# Patient Record
Sex: Female | Born: 2000 | Race: White | Hispanic: No | Marital: Single | Smoking: Never smoker
Health system: Southern US, Community
[De-identification: ages and names within clinical notes are randomized; demographics above are authoritative.]

## PROBLEM LIST (undated history)

## (undated) DIAGNOSIS — N390 Urinary tract infection, site not specified: Secondary | ICD-10-CM

## (undated) HISTORY — PX: URETER SURGERY: SHX823

---
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
## 2021-03-22 DIAGNOSIS — M545 Low back pain, unspecified: Secondary | ICD-10-CM | POA: Diagnosis not present

## 2021-03-22 DIAGNOSIS — Y93F2 Activity, caregiving, lifting: Secondary | ICD-10-CM | POA: Diagnosis not present

## 2021-04-13 DIAGNOSIS — Z130 Encounter for screening for diseases of the blood and blood-forming organs and certain disorders involving the immune mechanism: Secondary | ICD-10-CM | POA: Diagnosis not present

## 2021-04-13 DIAGNOSIS — N390 Urinary tract infection, site not specified: Secondary | ICD-10-CM | POA: Diagnosis not present

## 2021-04-13 DIAGNOSIS — Z3040 Encounter for surveillance of contraceptives, unspecified: Secondary | ICD-10-CM | POA: Diagnosis not present

## 2021-04-13 DIAGNOSIS — Z13228 Encounter for screening for other metabolic disorders: Secondary | ICD-10-CM | POA: Diagnosis not present

## 2021-04-13 DIAGNOSIS — F419 Anxiety disorder, unspecified: Secondary | ICD-10-CM | POA: Diagnosis not present

## 2021-04-21 IMAGING — US US PELVIS COMPLETE WITH TRANSVAGINAL
1 series · 15 of 25 positions shown · non-contrast
Comparison: None

CLINICAL DATA: Dyspareunia



[Series 1: us pelvis complete with transvaginal · 15 of 94 slices shown]
[im 1/94]
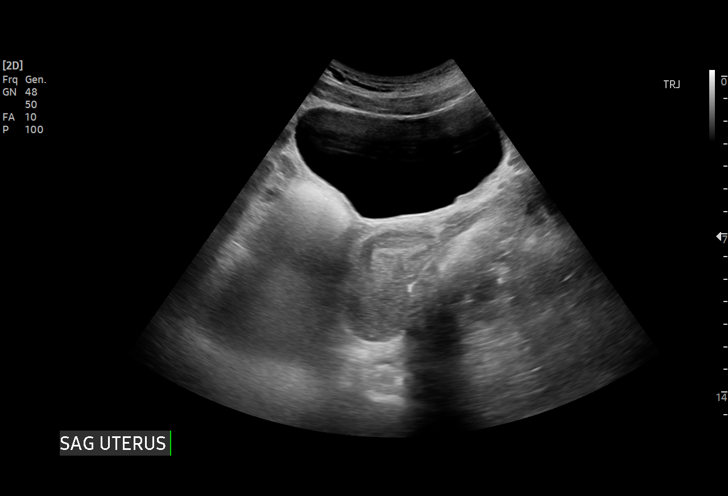
[im 8/94]
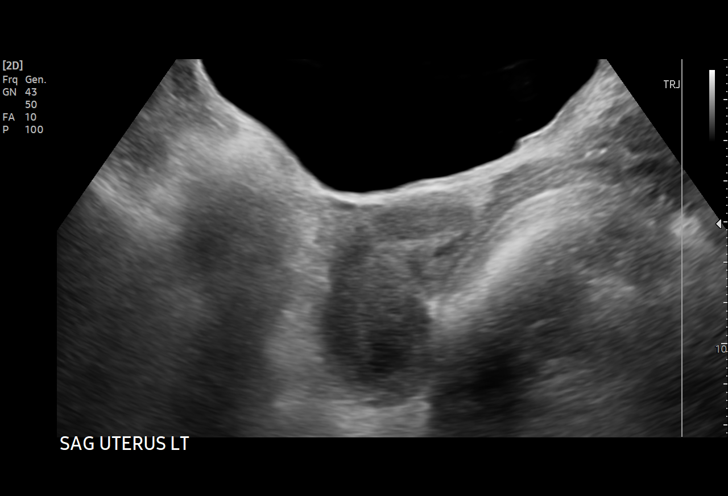
[im 16/94]
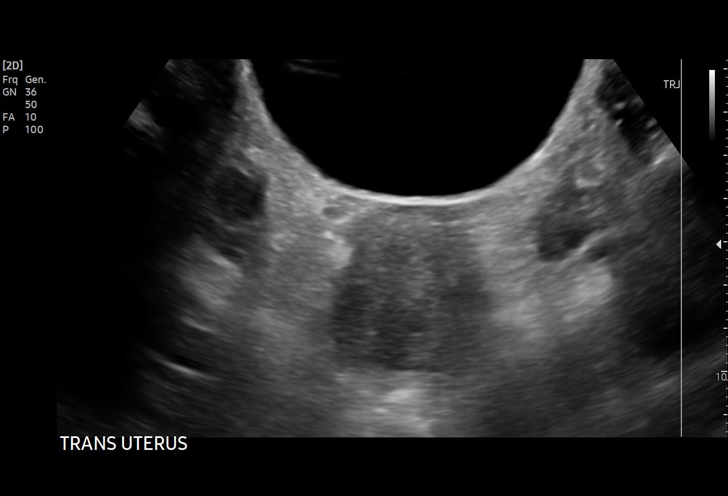
[im 20/94]
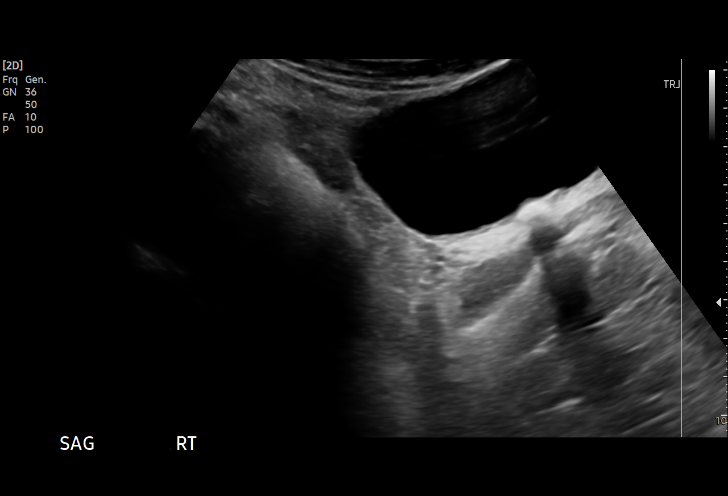
[im 28/94]
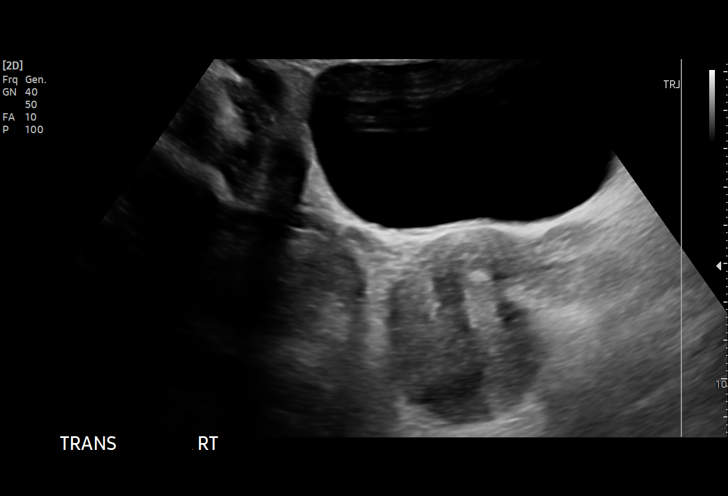
[im 35/94]
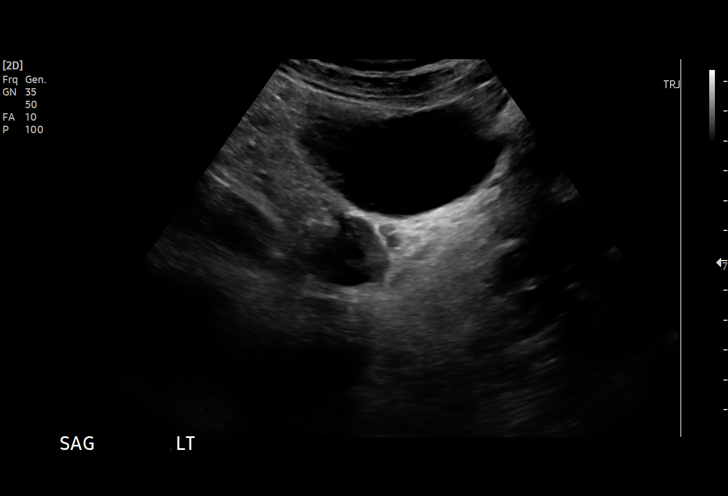
[im 39/94]
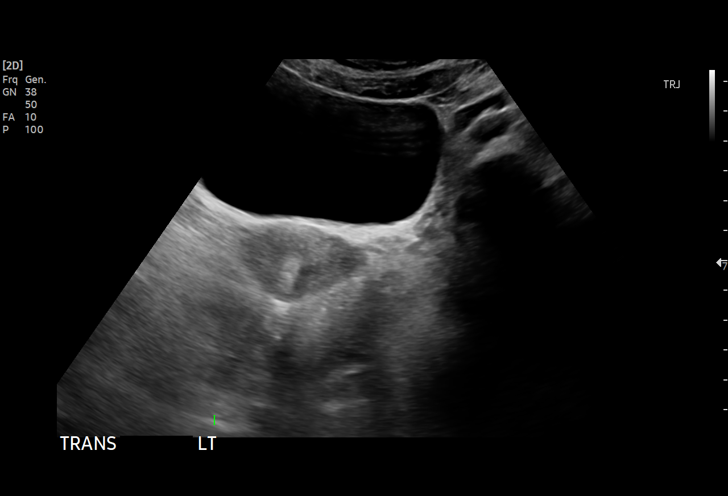
[im 47/94]
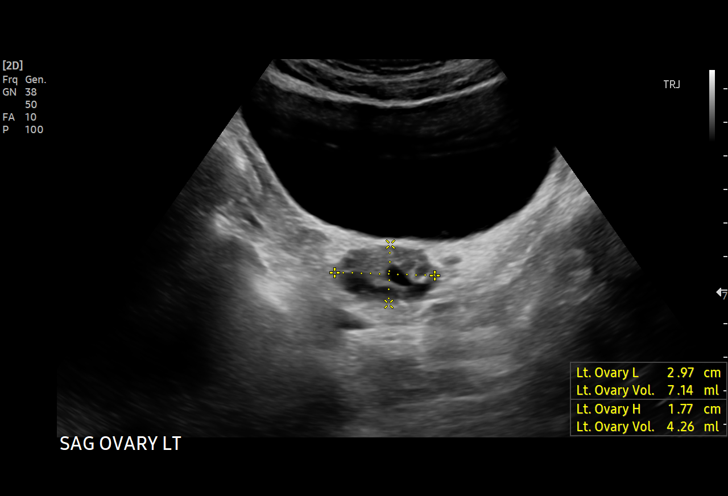
[im 55/94]
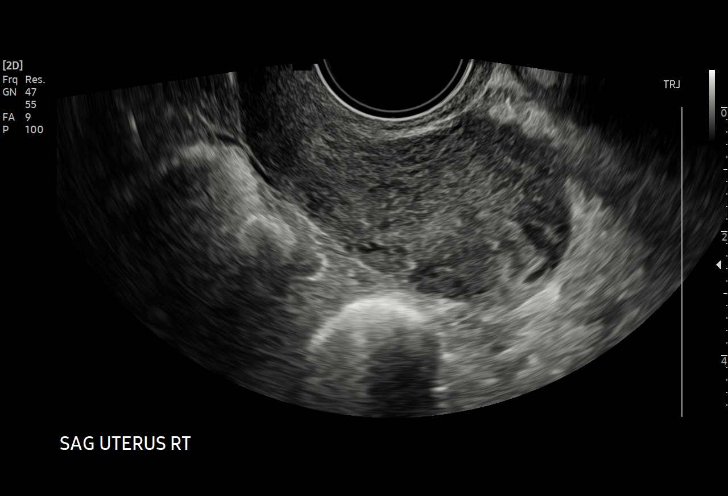
[im 59/94]
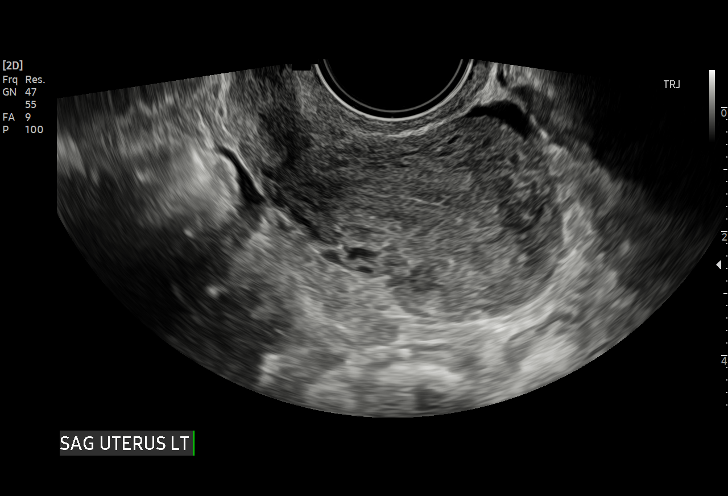
[im 66/94]
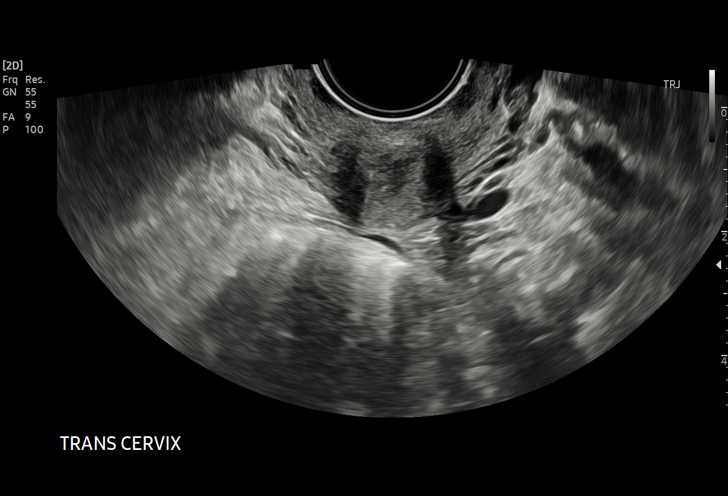
[im 74/94]
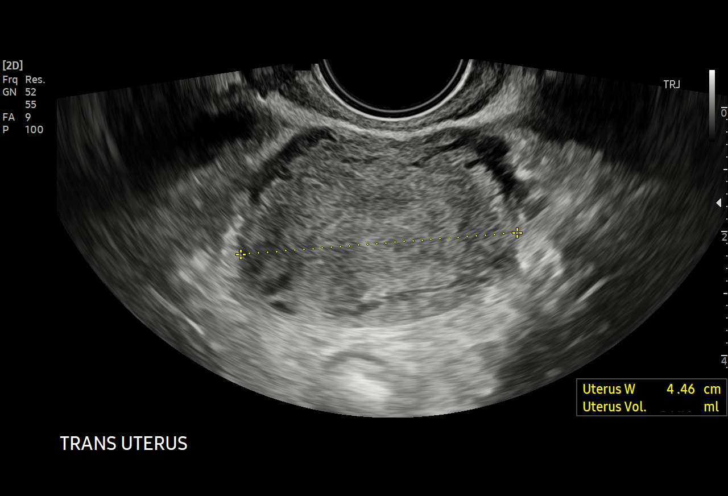
[im 78/94]
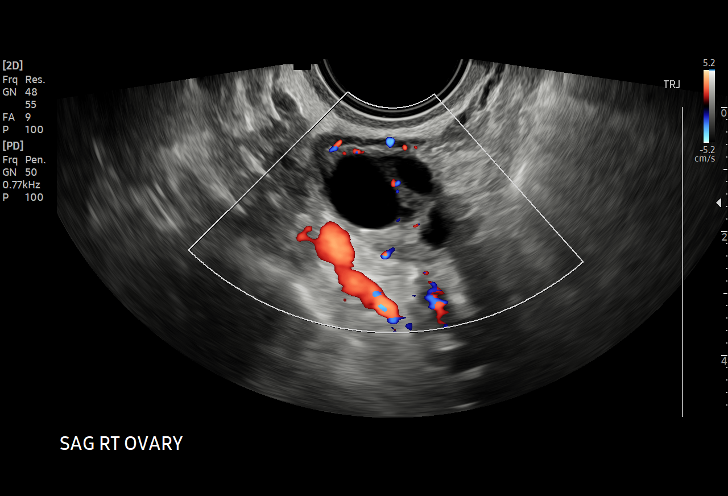
[im 86/94]
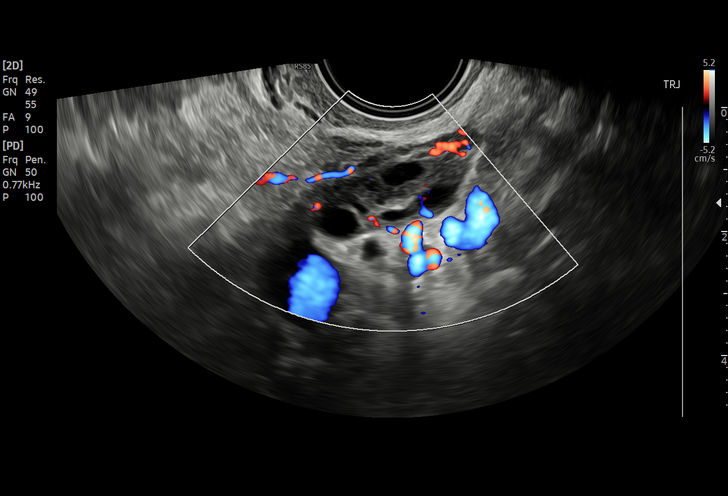
[im 94/94]
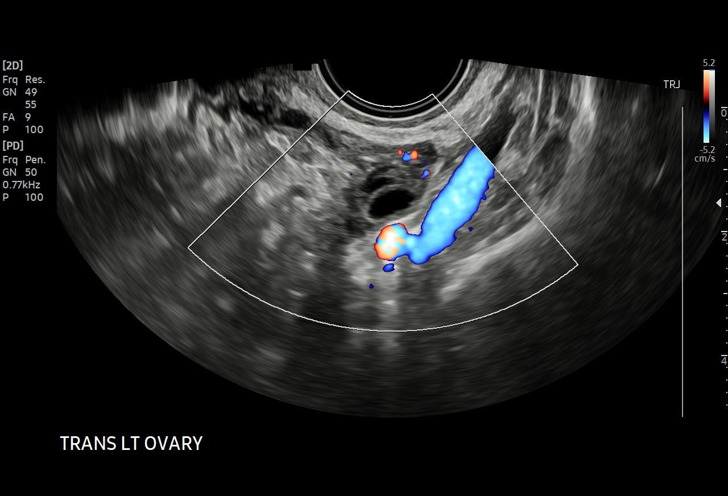

[15 of 25 positions shown; findings below may reference images not displayed]

FINDINGS: Uterus

Measurements: 6.4 x 2.8 x 4.6 cm = volume: 44 mL. No fibroids or
other mass visualized. Retroflexed.

Endometrium

Thickness: 5 mm in thickness.  No focal abnormality visualized.

Right ovary

Measurements: 2.8 x 1.8 x 2.0 cm = volume: 5.1 mL. Normal
appearance/no adnexal mass. Multiple small follicles.

Left ovary

Measurements: 2.8 x 1.3 x 1.4 cm = volume: 2.7 mL. Normal
appearance/no adnexal mass. Multiple small follicles.

Other findings

No abnormal free fluid.
IMPRESSION: Normal study.

## 2021-05-03 ENCOUNTER — Ambulatory Visit: Payer: Medicaid Other | Admitting: Obstetrics and Gynecology

## 2021-05-18 DIAGNOSIS — L089 Local infection of the skin and subcutaneous tissue, unspecified: Secondary | ICD-10-CM | POA: Diagnosis not present

## 2021-05-18 DIAGNOSIS — T7840XA Allergy, unspecified, initial encounter: Secondary | ICD-10-CM | POA: Diagnosis not present

## 2021-06-19 DIAGNOSIS — F329 Major depressive disorder, single episode, unspecified: Secondary | ICD-10-CM | POA: Diagnosis not present

## 2021-06-19 DIAGNOSIS — R300 Dysuria: Secondary | ICD-10-CM | POA: Diagnosis not present

## 2021-06-19 DIAGNOSIS — N390 Urinary tract infection, site not specified: Secondary | ICD-10-CM | POA: Diagnosis not present

## 2021-07-03 ENCOUNTER — Ambulatory Visit (INDEPENDENT_AMBULATORY_CARE_PROVIDER_SITE_OTHER): Payer: Medicaid Other | Admitting: Obstetrics and Gynecology

## 2021-07-03 ENCOUNTER — Encounter: Payer: Self-pay | Admitting: Obstetrics and Gynecology

## 2021-07-03 DIAGNOSIS — R87612 Low grade squamous intraepithelial lesion on cytologic smear of cervix (LGSIL): Secondary | ICD-10-CM | POA: Diagnosis not present

## 2021-07-03 DIAGNOSIS — F320 Major depressive disorder, single episode, mild: Secondary | ICD-10-CM | POA: Diagnosis not present

## 2021-07-03 DIAGNOSIS — B029 Zoster without complications: Secondary | ICD-10-CM | POA: Diagnosis not present

## 2021-07-03 NOTE — Progress Notes (Signed)
Patient is here to follow up on abnormal pap smear that she had done around November of last year at [facility]. She informed e of her results which were "HPV with LSIL".  ?

## 2021-07-03 NOTE — Progress Notes (Signed)
Pt reports to discuss abnormal pap smear results from 11/22. ? ?Pt had pap smear with HPV testing due to FH of cervical cancer and HPV. ?Pt reports she has completed the HPV vaccine ?Sexual active.  ? ?Pap LGSIL, HPV + 11/22 ? ?PE AF VSS ?Lungs clear Heart RRR ?Abd soft + BS ? ? ?A/P LGSIL HPV +  ? ?Information on HPV and LGSIL provided to pt. Recommend repeat pap smear with HPV co testing 11/23. ? ?

## 2021-07-03 NOTE — Patient Instructions (Signed)
Cervical Dysplasia ? ?Cervical dysplasia is a condition in which the cells in a woman's cervix have abnormal changes. The cervix is the opening of the uterus. It is located between the vagina and the uterus. Cervical dysplasia may be an early sign of cervical cancer. ?If left untreated, this condition may become more severe and may progress to cervical cancer. Early detection, treatment, and follow-up care are very important. ?What are the causes? ?Cervical dysplasia is usually caused by a human papillomavirus (HPV) infection. HPV is spread from person to person through sexual contact. This includes oral, vaginal, or anal sex. HPV is the most common sexually transmitted infection (STI). ?You are more likely to be exposed to HPV through sexual contact if: ?You have had more than one sexual partner or you have a sexual partner who has multiple sexual partners. ?You do not use a condom during sex, especially with new sexual partners. ?What increases the risk? ?The following factors may make you more likely to develop this condition: ?Having a family history of cervical cancer or a personal history of cancer of the vagina or vulva. ?Having had an STI, such as herpes, chlamydia, or gonorrhea. ?Becoming sexually active before age 17. ?Having a weakened disease-fighting system (immunesystem). ?Smoking. ?Being the daughter of a woman who took diethylstilbestrol (DES), a synthetic estrogen, during pregnancy. ?What are the signs or symptoms? ?There are usually no symptoms of this condition. If you do have symptoms, they may include: ?Abnormal vaginal discharge. ?Bleeding between periods or after sex. ?Bleeding during menopause. ?Pain during sex. ?How is this diagnosed? ?This condition may be diagnosed with a Pap test. During this test, cells are swabbed from the cervix and checked under a microscope. ?If the Pap test is abnormal or if the cervix looks abnormal, you may also have a test in which a tissue sample is removed from  the cervix and looked at under a microscope(biopsy). ?How is this treated? ?Treatment varies based on the severity of the condition. Treatment may include: ?Cryotherapy. During this therapy, the abnormal cells are frozen with a steel-tipped instrument. ?Loop electrosurgical excision procedure (LEEP). LEEP removes abnormal tissue from the cervix. ?Surgery to remove abnormal tissue. This is usually done in more severe cases. Options include: ?A cone biopsy. This treatment removes the cervical canal and part of the center of the cervix. ?Hysterectomy. This is a surgery in which the uterus and cervix are removed. ?Follow these instructions at home: ?Take over-the-counter and prescription medicines only as told by your health care provider. ?Do not use tampons, have sex, or douche until your health care provider says it is safe. ?Keep all follow-up visits. This is important. Women who have been treated for cervical dysplasia should have regular pelvic exams and Pap tests. ?How is this prevented? ?Practice safe sex to help prevent STIs. ?Have regular Pap tests. Talk with your health care provider about how often you need these tests. Pap tests will help identify cell changes that can lead to cancer. ?Ask your health care provider about possible vaccines to protect yourself against HPV. ?Contact a health care provider if: ?You develop genital warts. The risk of cervical cancer is higher with certain types of HPV. ?Your menstrual period is heavier than normal or you develop bright red bleeding, which may include blood clots. ?You have abnormal vaginal discharge. ?You have a fever. ?Get help right away if: ?You have pain or cramps in the abdomen that get worse, and medicine does not help to relieve your pain. ?You feel  light-headed and are unusually weak, or you faint. ?Summary ?Cervical dysplasia is a condition in which a woman's cervix cells have abnormal changes. ?If left untreated, this condition may become more severe  and may progress to cervical cancer. ?Early detection, treatment, and follow-up care are very important in managing this condition. ?Have regular pelvic exams and Pap tests. Talk with your health care provider about how often you need these tests. Pap tests will help identify cell changes that can lead to cancer. ?This information is not intended to replace advice given to you by your health care provider. Make sure you discuss any questions you have with your health care provider. ?Document Revised: 08/27/2019 Document Reviewed: 08/27/2019 ?Elsevier Patient Education ? [place]. ?HPV and Cancer Information ?HPV (human papillomavirus)is a very common virus that spreads easily from person to person through skin-to-skin or sexual contact. There are many types of HPV. It often does not cause symptoms. However, depending upon the type, it may sometimes cause warts in the genitals (genital or mucosal HPV), or on the hands or feet (cutaneous or nonmucosal HPV). It is possible to be infected for a long time and pass HPV to others without knowing it. ?Some HPV infections go away on their own within 2 years, but other HPV infections are considered high-risk and may cause changes in cells that could lead to cancer. You can take steps to avoid HPV infection and to lower your risk of getting cancer. ?How can HPV affect me? ?HPV can cause warts in the genitals or on the hands or feet. It can also cause wart-like lesions in the throat.  ?Certain types of genital HPV can also cause cancer, which may include: ?Cervical cancer. ?Vaginal cancer. ?Vulvar cancer. ?Anal cancer. ?Throat cancer. ?Tongue or mouth cancer. ?Penile cancer. ?How does HPV spread? ?HPV spreads easily through direct person to person contact. Genital HPV spreads through sexual contact. You can get HPV from vaginal sex, oral sex, anal sex, or just by touching someone's genitals. Even people who have only one sexual partner may have HPV because that partner  may have it. HPV often does not cause symptoms, so most infected people do not know that they have it. ?What actions can I take to prevent HPV? ?Take the following steps to help prevent HPV infection: ?Talk with your health care provider about getting the HPV vaccine. This vaccine protects against the types of HPV that could cause cancer. ?Limit the number of people you have sex with. Also, avoid having sex with people who have had many sexual partners. ?Use a condom during sex. ?Talk with your sexual partners about their health. ?What actions can I take to lower my risk for cancer? ?Having a healthy lifestyle and taking some preventive steps can help lower your cancer risk, whether or not you have genital HPV. Some steps you can take include: ?Lifestyle ?Practice safe sex to help prevent HPV infection. ?Do not use any products that contain nicotine or tobacco, such as cigarettes, e-cigarettes, and chewing tobacco. If you need help quitting, ask your health care provider. ?Eat foods that have antioxidants, such as fruits, vegetables, and grains. Try to eat at least 5 servings of fruits and vegetables every day. ?Get regular exercise. ?Lose weight if you are overweight. ?Practice good oral hygiene. This includes flossing and brushing your teeth every day. ?Other preventive steps ?Get the HPV vaccine as told by your health care provider. ?Get tested for STIs even if you do not have symptoms of HPV.  You may have HPV and not know it. ?If you are a woman, get regular Pap and HPV tests. Talk with your health care provider about how often you need these tests. Pap tests will help identify changes in cells that can lead to cancer. HPV tests will help identify the presence of HPV in cells in the cervix. ?Where to find more information ?Learn more about HPV and cancer from: ?Centers for Disease Control and Prevention: http://sweeney-todd.com/ ?[place]: www.cancer.gov ?American Cancer Society: www.cancer.org ?Contact  a health care provider if: ?You have genital warts. ?You are sexually active and think you may have HPV. ?You did not protect yourself during sex and would like to be tested for STIs. ?Summary ?Human

## 2022-04-22 ENCOUNTER — Ambulatory Visit (HOSPITAL_COMMUNITY)
Admission: EM | Admit: 2022-04-22 | Discharge: 2022-04-22 | Disposition: A | Discharge status: Home / Self Care | Payer: Medicaid Other | Attending: Emergency Medicine | Admitting: Emergency Medicine

## 2022-04-22 ENCOUNTER — Encounter (HOSPITAL_COMMUNITY): Payer: Self-pay

## 2022-04-22 DIAGNOSIS — Z87440 Personal history of urinary (tract) infections: Secondary | ICD-10-CM | POA: Insufficient documentation

## 2022-04-22 DIAGNOSIS — F17290 Nicotine dependence, other tobacco product, uncomplicated: Secondary | ICD-10-CM | POA: Insufficient documentation

## 2022-04-22 DIAGNOSIS — Z1152 Encounter for screening for COVID-19: Secondary | ICD-10-CM | POA: Insufficient documentation

## 2022-04-22 DIAGNOSIS — R109 Unspecified abdominal pain: Secondary | ICD-10-CM

## 2022-04-22 DIAGNOSIS — B349 Viral infection, unspecified: Secondary | ICD-10-CM | POA: Insufficient documentation

## 2022-04-22 LAB — POC INFLUENZA A AND B ANTIGEN (URGENT CARE ONLY)
INFLUENZA A ANTIGEN, POC: NEGATIVE
INFLUENZA B ANTIGEN, POC: NEGATIVE

## 2022-04-22 LAB — POCT URINALYSIS DIPSTICK, ED / UC
Bilirubin Urine: NEGATIVE
Glucose, UA: NEGATIVE mg/dL
Hgb urine dipstick: NEGATIVE
Ketones, ur: NEGATIVE mg/dL
Leukocytes,Ua: NEGATIVE
Nitrite: NEGATIVE
Protein, ur: NEGATIVE mg/dL
Specific Gravity, Urine: 1.025 (ref 1.005–1.030)
Urobilinogen, UA: 1 mg/dL (ref 0.0–1.0)
pH: 6.5 (ref 5.0–8.0)

## 2022-04-22 LAB — POC URINE PREG, ED: Preg Test, Ur: NEGATIVE

## 2022-04-22 NOTE — ED Provider Notes (Signed)
[facility]    CSN: [id number] Arrival date & time: 04/22/22  1724      History   Chief Complaint Chief Complaint  Patient presents with   Back Pain    HPI Jaime Osborne is a 22 y.o. female.   Patient presents with concerns of possible UTI or kidney infection. She states yesterday she developed low back pain, worse on the right. She felt feverish last night with a 99.2F temp and chills. She has had some intermittent nausea but no vomiting. She reports some general malaise and mild headache and lower abdominal discomfort. She denies any urinary frequency or urgency, dysuria, or discharge. She does state she is urinating less often than usual. She took some NyQuil last night. The patient does report history of frequent UTIs and has had a kidney infection in the past as well. She states she took a home UTI test that was positive for leuks but negative for nitrites. She reports some mild throat irritation and congestion as well but denies cough.   The history is provided by the patient.  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 Authorizing Provider  cariprazine (VRAYLAR) 1.5 MG capsule Vraylar 1.5 mg capsule  Take 1 capsule every day by oral route.    [provider]  ciprofloxacin (CIPRO) 500 MG tablet SMARTSIG:1 Tablet(s) By Mouth Every 12 Hours 06/24/21   [provider]    Family History Family History  Problem Relation Age of Onset   Healthy Mother    Healthy Father    Cancer Maternal Grandmother        brain    Social History Social History   Tobacco Use   Smoking status: Every Day    Types: E-cigarettes   Smokeless tobacco: Current  Vaping Use   Vaping Use: Every day   Substances: Nicotine  Substance Use Topics   Alcohol use: No   Drug use: Never     Allergies   Penicillins, Sulfa antibiotics, and Bactrim [sulfamethoxazole-trimethoprim]   Review of Systems Review of Systems  Constitutional:  Positive for chills and fatigue. Negative for fever.  Respiratory:  Negative for shortness of breath.   Gastrointestinal:  Positive for nausea. Negative for vomiting.  Genitourinary:  Positive for flank pain. Negative for dysuria, frequency, hematuria,  urgency and vaginal discharge.  Musculoskeletal:  Positive for back pain.  Skin:  Negative for rash.  Neurological:  Positive for headaches. Negative for dizziness.     Physical Exam Triage Vital Signs ED Triage Vitals [04/22/22 1901]  Enc Vitals Group     BP 111/79     Pulse Rate 100     Resp 14     Temp 98.8 F (37.1 C)     Temp Source Oral     SpO2 98 %     Weight      Height      Head Circumference      Peak Flow      Pain Score 8     Pain Loc      Pain Edu?      Excl. in [place]?    No data found.  Updated Vital Signs BP 111/79 (BP Location: Left Arm)   Pulse 100   Temp 98.8 F (37.1 C) (Oral)   Resp 14   LMP 04/01/2022 (Approximate)   SpO2 98%   Visual Acuity Right Eye Distance:   Left Eye Distance:   Bilateral Distance:    Right  Eye Near:   Left Eye Near:    Bilateral Near:     Physical Exam Vitals and nursing note reviewed.  Constitutional:      General: She is not in acute distress. HENT:     Head: Normocephalic.  Cardiovascular:     Rate and Rhythm: Normal rate and regular rhythm.     Heart sounds: Normal heart sounds.  Pulmonary:     Effort: Pulmonary effort is normal.     Breath sounds: Normal breath sounds.  Abdominal:     Palpations: Abdomen is soft.     Tenderness: There is abdominal tenderness (suprapubic). There is no right CVA tenderness, left CVA tenderness, guarding or rebound.  Skin:    Findings: No rash.  Neurological:     Mental Status: She is alert.     Gait: Gait normal.  Psychiatric:        Mood and Affect: Mood normal.      UC Treatments / Results  Labs (all labs ordered are listed, but only abnormal results are displayed) Labs Reviewed  SARS CORONAVIRUS 2 (TAT 6-24 HRS)  URINE CULTURE  POCT URINALYSIS DIPSTICK, ED / UC  POC URINE PREG, ED  POC INFLUENZA A AND B ANTIGEN (URGENT CARE ONLY)    EKG   Radiology No results found.  Procedures Procedures (including critical care time)  Medications Ordered in UC Medications - No data to display  Initial Impression / Assessment and Plan / UC Course  I have reviewed the triage vital signs and the nursing notes.  Pertinent labs & imaging results that were available during my care of the patient were reviewed by me and considered in my medical decision making (see chart for details).     U/A clean, no dysuria. No CVA tenderness and sx not fully consistent with pyelo. Neg flu. Sx more consistent with viral infection. Sx tx and discussed return precautions. Send out Ucx to confirm no UTI and COVID.   E/M: 1 acute uncomplicated illness, 5 data, low risk  Final Clinical Impressions(s) / UC Diagnoses   Final diagnoses:  Viral infection     Discharge Instructions      Flu test negative. We are sending out COVID test  and will contact you with results. Urine did not show signs of UTI - we will  send out for culture and contact you with results. Symptoms likely due to viral infection. Continue OTC medicine, rest, and keep hydrated. Return to care if no improvement in a week or if develop new concerning symptoms such as high fever, vomiting, or pain with urination.     ED Prescriptions   None    PDMP not reviewed this encounter.   Delsa Sale, [place] 04/22/22 2007

## 2022-04-22 NOTE — ED Triage Notes (Signed)
Patient c/o bilateral lower back pain R>L x 2 days. Patient states she has had intermittent nausea.  Patient did an at home UTI test and it was +. Patient also reports that she has only voided twice today.  Patient reports a history of UTI's

## 2022-04-22 NOTE — Discharge Instructions (Signed)
Flu test negative. We are sending out COVID test and will contact you with results. Urine did not show signs of UTI - we will send out for culture and contact you with results. Symptoms likely due to viral infection. Continue OTC medicine, rest, and keep hydrated. Return to care if no improvement in a week or if develop new concerning symptoms such as high fever, vomiting, or pain with urination.

## 2022-04-23 LAB — SARS CORONAVIRUS 2 (TAT 6-24 HRS): SARS Coronavirus 2: NEGATIVE

## 2022-04-23 LAB — URINE CULTURE: Culture: NO GROWTH

## 2022-05-14 ENCOUNTER — Ambulatory Visit (HOSPITAL_COMMUNITY)
Admission: EM | Admit: 2022-05-14 | Discharge: 2022-05-14 | Disposition: A | Discharge status: Home / Self Care | Payer: Self-pay | Attending: Internal Medicine | Admitting: Internal Medicine

## 2022-05-14 ENCOUNTER — Encounter (HOSPITAL_COMMUNITY): Payer: Self-pay

## 2022-05-14 ENCOUNTER — Ambulatory Visit (HOSPITAL_COMMUNITY)
Admission: EM | Admit: 2022-05-14 | Discharge: 2022-05-14 | Disposition: A | Discharge status: Home / Self Care | Payer: Self-pay

## 2022-05-14 ENCOUNTER — Other Ambulatory Visit: Payer: Self-pay

## 2022-05-14 DIAGNOSIS — R300 Dysuria: Secondary | ICD-10-CM | POA: Insufficient documentation

## 2022-05-14 DIAGNOSIS — N3001 Acute cystitis with hematuria: Secondary | ICD-10-CM | POA: Insufficient documentation

## 2022-05-14 LAB — POCT URINALYSIS DIPSTICK, ED / UC
Bilirubin Urine: NEGATIVE
Glucose, UA: NEGATIVE mg/dL
Ketones, ur: 15 mg/dL — AB
Nitrite: NEGATIVE
Protein, ur: 100 mg/dL — AB
Specific Gravity, Urine: >=1.03 (ref 1.005–1.030)
Urobilinogen, UA: 0.2 mg/dL (ref 0.0–1.0)
pH: 7 (ref 5.0–8.0)

## 2022-05-14 MED ORDER — CEPHALEXIN 500 MG PO CAPS: 500.0000 mg | ORAL_CAPSULE | Freq: Two times a day (BID) | ORAL | 0 refills | Status: AC

## 2022-05-14 NOTE — Discharge Instructions (Signed)
Your urine shows you likely have a urinary tract infection. I have sent your urine for culture to confirm this. We will go ahead and have you start taking antibiotics due to your symptoms.  Take antibiotic as directed.  (Keflex 500mg every 12 hours for 7 days) If you develop diarrhea while taking this medication you may purchase an over-the-counter probiotic or eat yogurt with live active cultures.  To avoid GI upset please take this medication with food. I have sent your urine for culture to see what type of bacteria grows. We will call you if we need to change the treatment plan based on the results of your urine culture.  If you develop any new or worsening symptoms or do not improve in the next 2 to 3 days, please return.  If your symptoms are severe, please go to the emergency room.  Follow-up with your primary care provider for further evaluation and management of your symptoms as well as ongoing wellness visits.  I hope you feel better!  

## 2022-05-14 NOTE — ED Provider Notes (Signed)
[place]    CSN: [id number] Arrival date & time: 05/14/22  1706      History   Chief Complaint Chief Complaint  Patient presents with   Urinary Tract Infection    HPI Jaime Osborne is a 22 y.o. female.   Patient presents to urgent care for evaluation of dysuria and low back pain for the last 3 days.  Patient has a significant medical history of sepsis due to pyelonephritis when she was 22 years old (5 years ago) and multiple urinary tract infections since.  She has not been treated for a UTI in the last 3 months.  She is not having any nausea, vomiting, diarrhea, constipation, abdominal pain, urinary frequency, urinary urgency, or urinary hesitancy.  She states she has not seen any gross hematuria and denies bilateral upper back/flank pain.  No fever/chills reported.  No recent viral URI symptoms.  Tolerating food and fluids well without difficulty.  No recent antibiotic or steroid use.Patient is allergic to penicillin antibiotics, however she states that she believes her parents told her providers this when she was younger because her brother has an allergy to penicillins.  She does not remember having a rash or any anaphylactic reactions to penicillins and her parents have never said anything of the sort.  History of allergy to Bactrim.   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 Authorizing Provider  cephALEXin (KEFLEX) 500 MG capsule Take 1 capsule (500 mg total) by mouth 2 (two) times daily for 7 days. 05/14/22 05/21/22 Yes Samari Bittinger, Stasia Cavalier, FNP  cariprazine (VRAYLAR) 1.5 MG capsule Vraylar 1.5 mg capsule  Take 1 capsule every day by oral route.    [provider]  ciprofloxacin (CIPRO) 500 MG tablet SMARTSIG:1 Tablet(s) By Mouth Every 12 Hours 06/24/21   [provider]    Family History Family History  Problem Relation Age of Onset   Healthy Mother    Healthy Father    Cancer Maternal Grandmother        brain    Social History Social History   Tobacco Use   Smoking status: Every Day    Types: E-cigarettes   Smokeless tobacco: Current  Vaping Use   Vaping Use: Every day   Substances: Nicotine  Substance Use Topics   Alcohol use: No   Drug use: Never     Allergies  Penicillins, Sulfa antibiotics, and Bactrim [sulfamethoxazole-trimethoprim]   Review of Systems Review of Systems Per HPI  Physical Exam Triage Vital Signs ED Triage Vitals [05/14/22 1810]  Enc Vitals Group     BP 115/78     Pulse Rate 98     Resp 18     Temp 98.6 F (37 C)     Temp Source Oral     SpO2 98 %     Weight      Height      Head Circumference      Peak Flow      Pain Score      Pain Loc      Pain Edu?      Excl. in [place]?    No data found.  Updated Vital Signs BP 115/78 (BP Location: Left Arm)   Pulse 98   Temp 98.6 F (37 C) (Oral)   Resp 18   LMP 04/23/2022 (Approximate)   SpO2 98%   Visual Acuity Right Eye Distance:   Left Eye Distance:   Bilateral Distance:    Right Eye Near:   Left Eye Near:    Bilateral Near:     Physical Exam Vitals and nursing note reviewed.   Constitutional:      Appearance: She is not ill-appearing or toxic-appearing.  HENT:     Head: Normocephalic and atraumatic.     Right Ear: Hearing and external ear normal.     Left Ear: Hearing and external ear normal.     Nose: Nose normal.     Mouth/Throat:     Lips: Pink.     Mouth: Mucous membranes are moist. No injury.     Tongue: No lesions. Tongue does not deviate from midline.     Palate: No mass and lesions.     Pharynx: Oropharynx is clear. Uvula midline. No pharyngeal swelling, oropharyngeal exudate, posterior oropharyngeal erythema or uvula swelling.     Tonsils: No tonsillar exudate or tonsillar abscesses.  Eyes:     General: Lids are normal. Vision grossly intact. Gaze aligned appropriately.     Extraocular Movements: Extraocular movements intact.     Conjunctiva/sclera: Conjunctivae normal.  Cardiovascular:     Rate and Rhythm: Normal rate and regular rhythm.     Heart sounds: Normal heart sounds, S1 normal and S2 normal.  Pulmonary:     Effort: Pulmonary effort is normal. No respiratory distress.     Breath sounds: Normal breath sounds and air entry.  Abdominal:     General: Bowel sounds are normal.     Palpations: Abdomen is soft.     Tenderness: There is no abdominal tenderness. There is no right CVA tenderness, left CVA tenderness or guarding.  Musculoskeletal:     Cervical back: Neck supple.  Skin:    General: Skin is warm and dry.     Capillary Refill: Capillary refill takes less than 2 seconds.     Findings: No rash.  Neurological:     General: No focal deficit present.     Mental Status: She is alert and oriented to person, place, and time. Mental status is at baseline.     Cranial Nerves: No dysarthria or facial asymmetry.  Psychiatric:        Mood and Affect: Mood normal.        Speech: Speech normal.        Behavior: Behavior normal.        Thought Content: Thought content normal.  Judgment: Judgment normal.      UC Treatments /  Results  Labs (all labs ordered are listed, but only abnormal results are displayed) Labs Reviewed  POCT URINALYSIS DIPSTICK, ED / UC - Abnormal; Notable for the following components:      Result Value   Ketones, ur 15 (*)    Hgb urine dipstick LARGE (*)    Protein, ur 100 (*)    Leukocytes,Ua SMALL (*)    All other components within normal limits  URINE CULTURE    EKG   Radiology No results found.  Procedures Procedures (including critical care time)  Medications Ordered in UC Medications - No data to display  Initial Impression / Assessment and Plan / UC Course  I have reviewed the triage vital signs and the nursing notes.  Pertinent labs & imaging results that were available during my care of the patient were reviewed by me and considered in my medical decision making (see chart for details).   1.  Acute cystitis with hematuria Presentation is consistent with acute uncomplicated cystitis. Patient is nontoxic in appearance with hemodynamically stable vital signs. Low suspicion for acute pyelonephritis. Low suspicion for kidney stone or infected stone. Urine pregnancy is negative. No indication for labs or imaging at this time.  Keflex sent to pharmacy. Denies allergies to antibiotics. Urine culture pending. Patient to push fluids to stay well hydrated and reduce intake of known urinary irritants.  Discussed physical exam and available lab work findings in clinic with patient.  Counseled patient regarding appropriate use of medications and potential side effects for all medications recommended or prescribed today. Discussed red flag signs and symptoms of worsening condition,when to call the PCP office, return to urgent care, and when to seek higher level of care in the emergency department. Patient verbalizes understanding and agreement with plan. All questions answered. Patient discharged in stable condition.    Final Clinical Impressions(s) / UC Diagnoses   Final  diagnoses:  Acute cystitis with hematuria  Dysuria     Discharge Instructions      Your urine shows you likely have a urinary tract infection. I have sent your urine for culture to confirm this. We will go ahead and have you start taking antibiotics due to your symptoms.  Take antibiotic as directed.  (Keflex '500mg'$  every 12 hours for 7 days) If you develop diarrhea while taking this medication you may purchase an over-the-counter probiotic or eat yogurt with live active cultures.  To avoid GI upset please take this medication with food. I have sent your urine for culture to see what type of bacteria grows. We will call you if we need to change the treatment plan based on the results of your urine culture.  If you develop any new or worsening symptoms or do not improve in the next 2 to 3 days, please return.  If your symptoms are severe, please go to the emergency room.  Follow-up with your primary care provider for further evaluation and management of your symptoms as well as ongoing wellness visits.  I hope you feel better!    ED Prescriptions     Medication Sig Dispense Auth. Provider   cephALEXin (KEFLEX) 500 MG capsule Take 1 capsule (500 mg total) by mouth 2 (two) times daily for 7 days. 14 capsule Talbot Grumbling, FNP      PDMP not reviewed this encounter.   Talbot Grumbling, [place] 05/14/22 1911

## 2022-05-14 NOTE — ED Triage Notes (Signed)
Pt c/o dysuria x 3 days. Pt reports lower back. Denies taking any medication to help with symptoms.

## 2022-05-17 LAB — URINE CULTURE: Culture: >=100000 — AB

## 2022-07-25 DIAGNOSIS — R29898 Other symptoms and signs involving the musculoskeletal system: Secondary | ICD-10-CM | POA: Diagnosis not present

## 2022-08-21 ENCOUNTER — Encounter (HOSPITAL_BASED_OUTPATIENT_CLINIC_OR_DEPARTMENT_OTHER): Payer: Self-pay

## 2022-08-21 ENCOUNTER — Other Ambulatory Visit: Payer: Self-pay

## 2022-08-21 ENCOUNTER — Emergency Department (HOSPITAL_BASED_OUTPATIENT_CLINIC_OR_DEPARTMENT_OTHER)
Admission: EM | Admit: 2022-08-21 | Discharge: 2022-08-21 | Disposition: A | Discharge status: Home / Self Care | Payer: Medicaid Other | Attending: Emergency Medicine | Admitting: Emergency Medicine

## 2022-08-21 DIAGNOSIS — N12 Tubulo-interstitial nephritis, not specified as acute or chronic: Secondary | ICD-10-CM | POA: Insufficient documentation

## 2022-08-21 DIAGNOSIS — R309 Painful micturition, unspecified: Secondary | ICD-10-CM | POA: Diagnosis present

## 2022-08-21 LAB — URINALYSIS, ROUTINE W REFLEX MICROSCOPIC
Bacteria, UA: NONE SEEN
Bilirubin Urine: NEGATIVE
Glucose, UA: NEGATIVE mg/dL
Hgb urine dipstick: NEGATIVE
Ketones, ur: NEGATIVE mg/dL
Nitrite: NEGATIVE
Specific Gravity, Urine: 1.036 — ABNORMAL HIGH (ref 1.005–1.030)
WBC, UA: >50 WBC/hpf (ref 0–5)
pH: 5.5 (ref 5.0–8.0)

## 2022-08-21 LAB — COMPREHENSIVE METABOLIC PANEL
ALT: 8 U/L (ref 0–44)
AST: 13 U/L — ABNORMAL LOW (ref 15–41)
Albumin: 4.7 g/dL (ref 3.5–5.0)
Alkaline Phosphatase: 33 U/L — ABNORMAL LOW (ref 38–126)
Anion gap: 8 (ref 5–15)
BUN: 10 mg/dL (ref 6–20)
CO2: 26 mmol/L (ref 22–32)
Calcium: 9.7 mg/dL (ref 8.9–10.3)
Chloride: 103 mmol/L (ref 98–111)
Creatinine, Ser: 0.87 mg/dL (ref 0.44–1.00)
GFR, Estimated: >60 mL/min (ref >60)
Glucose, Bld: 77 mg/dL (ref 70–99)
Potassium: 3.7 mmol/L (ref 3.5–5.1)
Sodium: 137 mmol/L (ref 135–145)
Total Bilirubin: 0.6 mg/dL (ref 0.3–1.2)
Total Protein: 7.7 g/dL (ref 6.5–8.1)

## 2022-08-21 LAB — CBC
HCT: 39.5 % (ref 36.0–46.0)
Hemoglobin: 13.8 g/dL (ref 12.0–15.0)
MCH: 30.7 pg (ref 26.0–34.0)
MCHC: 34.9 g/dL (ref 30.0–36.0)
MCV: 87.8 fL (ref 80.0–100.0)
Platelets: 273 10*3/uL (ref 150–400)
RBC: 4.5 MIL/uL (ref 3.87–5.11)
RDW: 12.3 % (ref 11.5–15.5)
WBC: 9.4 10*3/uL (ref 4.0–10.5)
nRBC: 0 % (ref 0.0–0.2)

## 2022-08-21 LAB — PREGNANCY, URINE: Preg Test, Ur: NEGATIVE

## 2022-08-21 MED ORDER — ONDANSETRON 8 MG PO TBDP: 8.0000 mg | ORAL_TABLET | Freq: Three times a day (TID) | ORAL | 0 refills | Status: AC | PRN

## 2022-08-21 MED ORDER — CIPROFLOXACIN HCL 500 MG PO TABS
500.0000 mg | ORAL_TABLET | Freq: Once | ORAL | Status: AC
  Administered 2022-08-21: 500 mg via ORAL
  Filled 2022-08-21: qty 1

## 2022-08-21 MED ORDER — VITAMIN B-6 100 MG PO TABS
200.0000 mg | ORAL_TABLET | Freq: Every day | ORAL | Status: DC
  Administered 2022-08-21: 200 mg via ORAL
  Filled 2022-08-21: qty 2

## 2022-08-21 MED ORDER — PHENAZOPYRIDINE HCL 200 MG PO TABS: 200.0000 mg | ORAL_TABLET | Freq: Three times a day (TID) | ORAL | 0 refills | Status: AC

## 2022-08-21 MED ORDER — CIPROFLOXACIN HCL 500 MG PO TABS: 500.0000 mg | ORAL_TABLET | Freq: Two times a day (BID) | ORAL | 0 refills | Status: AC

## 2022-08-21 NOTE — ED Provider Notes (Signed)
[place] EMERGENCY DEPARTMENT AT [facility] Provider Note   CSN: [id number] Arrival date & time: 08/21/22  1720     History  Chief Complaint  Patient presents with   Flank Pain    Jaime Osborne is a 22 y.o. female.  HPI    22 year old female with multiple UTIs in the past comes in with chief complaint of burning with urination.  Patient states that she has been having urinary frequency and burning with urination for the last 2 days.  Now she is having bilateral flank pain, right worse than left.  She has had multiple UTIs in the past, typically back pain is not involved.  The symptoms currently are more severe than usual.  Patient indicates that she has low-grade fevers.  She has no history of recent STI and denies any vaginal discharge, or high risk behavior for STI.  Home Medications Prior to Admission medications   Medication Sig Start Date End Date Taking? Authorizing Provider  ciprofloxacin (CIPRO) 500 MG tablet Take 1 tablet (500 mg total) by mouth 2 (two) times daily. 08/21/22  Yes Ayaan Ringle, MD  ondansetron (ZOFRAN-ODT) 8 MG disintegrating tablet Take 1 tablet (8 mg total) by mouth every 8 (eight) hours as needed for nausea. 08/21/22  Yes Derwood Kaplan, MD  phenazopyridine (PYRIDIUM) 200 MG tablet Take 1 tablet (200 mg total) by mouth 3 (three) times daily. 08/21/22  Yes Derwood Kaplan, MD  cariprazine (VRAYLAR) 1.5 MG capsule Vraylar 1.5 mg capsule  Take 1 capsule every day by oral route.    [provider]      Allergies    Penicillins, Sulfa antibiotics, and Bactrim [sulfamethoxazole-trimethoprim]    Review of Systems   Review of Systems  All other systems reviewed and are negative.   Physical Exam Updated Vital Signs BP 115/75 (BP Location: Right Arm)   Pulse 79   Temp 98.3 F (36.8 C) (Oral)   Resp 18   Ht 5\' 3"  (1.6 m)   Wt 63.5 kg   SpO2 100%   BMI 24.80 kg/m  Physical Exam Vitals and nursing note reviewed.   Constitutional:      Appearance: She is well-developed.  HENT:     Head: Normocephalic and atraumatic.  Eyes:     Extraocular Movements: Extraocular movements intact.  Cardiovascular:     Rate and Rhythm: Normal rate.  Pulmonary:     Effort: Pulmonary effort is normal.  Abdominal:     Tenderness: There is right CVA tenderness.  Musculoskeletal:     Cervical back: Normal range of motion and neck supple.  Skin:    General: Skin is dry.  Neurological:     Mental Status: She is alert and oriented to person, place, and time.     ED Results / Procedures / Treatments   Labs (all labs ordered are listed, but only abnormal results are displayed) Labs Reviewed  COMPREHENSIVE METABOLIC PANEL - Abnormal; Notable for the following components:      Result Value   AST 13 (*)    Alkaline Phosphatase 33 (*)    All other components within normal limits  URINALYSIS, ROUTINE W REFLEX MICROSCOPIC - Abnormal; Notable for the following components:   Specific Gravity, Urine 1.036 (*)    Protein, ur TRACE (*)    Leukocytes,Ua MODERATE (*)    All other components within normal limits  CBC  PREGNANCY, URINE    EKG None  Radiology No results found.  Procedures Procedures    Medications  Ordered in ED Medications  pyridOXINE (VITAMIN B6) tablet 200 mg (200 mg Oral Given 08/21/22 2050)  ciprofloxacin (CIPRO) tablet 500 mg (500 mg Oral Given 08/21/22 2050)    ED Course/ Medical Decision Making/ A&P                             Medical Decision Making Amount and/or Complexity of Data Reviewed Labs: ordered.  Risk OTC drugs. Prescription drug management.  22 year old patient comes in with chief complaint of burning with urination, increased urinary frequency and now low-grade fever and flank pain, right worse than left.  Patient has previous history of UTIs.  Symptoms consistent with her previous UTI, except for the flank pain.  Differential diagnosis for this patient includes  acute cystitis, pyelonephritis, renal stones.  High suspicion for pyelonephritis.  We will give her ciprofloxacin.  I reviewed patient's previous urine cultures, she was mostly pansensitive.  No clinical concerns for STI at this time.  Final Clinical Impression(s) / ED Diagnoses Final diagnoses:  Pyelonephritis    Rx / DC Orders ED Discharge Orders          Ordered    ciprofloxacin (CIPRO) 500 MG tablet  2 times daily        08/21/22 2052    ondansetron (ZOFRAN-ODT) 8 MG disintegrating tablet  Every 8 hours PRN        08/21/22 2052    phenazopyridine (PYRIDIUM) 200 MG tablet  3 times daily        08/21/22 2052              Derwood Kaplan, MD 08/21/22 2056

## 2022-08-21 NOTE — Discharge Instructions (Signed)
Take the medications as prescribed.   Please return to the ER if your symptoms worsen; you have increased pain, fevers, chills, inability to keep any medications down, confusion.

## 2022-08-21 NOTE — ED Notes (Signed)
Discharge paperwork given and verbally understood. 

## 2022-08-21 NOTE — ED Triage Notes (Signed)
Patient here POV from home.  Endorses Dysuria for 2 Days. Noted to have Bilateral Flank Pain as well. Frequency.   99.5 Temp yesterday. Some nausea. No Emesis. No Diarrhea.   NAD Noted during Triage. A&Ox4. Gcs 15. Ambulatory.

## 2022-12-07 ENCOUNTER — Encounter (HOSPITAL_BASED_OUTPATIENT_CLINIC_OR_DEPARTMENT_OTHER): Payer: Self-pay

## 2022-12-07 ENCOUNTER — Other Ambulatory Visit: Payer: Self-pay

## 2022-12-07 DIAGNOSIS — R519 Headache, unspecified: Secondary | ICD-10-CM | POA: Insufficient documentation

## 2022-12-07 DIAGNOSIS — R300 Dysuria: Secondary | ICD-10-CM | POA: Insufficient documentation

## 2022-12-07 DIAGNOSIS — R109 Unspecified abdominal pain: Secondary | ICD-10-CM | POA: Diagnosis not present

## 2022-12-07 NOTE — ED Triage Notes (Signed)
Pt presents with dysuria and HA x 1 week. Pt states she thinks she has a UTI.

## 2022-12-08 ENCOUNTER — Emergency Department (HOSPITAL_BASED_OUTPATIENT_CLINIC_OR_DEPARTMENT_OTHER)
Admission: EM | Admit: 2022-12-08 | Discharge: 2022-12-08 | Disposition: A | Discharge status: Home / Self Care | Payer: Medicaid Other | Attending: Emergency Medicine | Admitting: Emergency Medicine

## 2022-12-08 DIAGNOSIS — R519 Headache, unspecified: Secondary | ICD-10-CM

## 2022-12-08 LAB — URINALYSIS, ROUTINE W REFLEX MICROSCOPIC
Bilirubin Urine: NEGATIVE
Glucose, UA: NEGATIVE mg/dL
Hgb urine dipstick: NEGATIVE
Ketones, ur: NEGATIVE mg/dL
Leukocytes,Ua: NEGATIVE
Nitrite: NEGATIVE
Protein, ur: NEGATIVE mg/dL
Specific Gravity, Urine: 1.01 (ref 1.005–1.030)
pH: 5.5 (ref 5.0–8.0)

## 2022-12-08 LAB — PREGNANCY, URINE: Preg Test, Ur: NEGATIVE

## 2022-12-08 MED ORDER — SODIUM CHLORIDE 0.9 % IV BOLUS
1000.0000 mL | Freq: Once | INTRAVENOUS | Status: AC
  Administered 2022-12-08: 1000 mL via INTRAVENOUS

## 2022-12-08 MED ORDER — DIPHENHYDRAMINE HCL 50 MG/ML IJ SOLN
25.0000 mg | Freq: Once | INTRAMUSCULAR | Status: AC
  Administered 2022-12-08: 25 mg via INTRAVENOUS
  Filled 2022-12-08: qty 1

## 2022-12-08 MED ORDER — KETOROLAC TROMETHAMINE 15 MG/ML IJ SOLN
15.0000 mg | Freq: Once | INTRAMUSCULAR | Status: AC
  Administered 2022-12-08: 15 mg via INTRAVENOUS
  Filled 2022-12-08: qty 1

## 2022-12-08 MED ORDER — PROCHLORPERAZINE EDISYLATE 10 MG/2ML IJ SOLN
10.0000 mg | Freq: Once | INTRAMUSCULAR | Status: AC
  Administered 2022-12-08: 10 mg via INTRAVENOUS
  Filled 2022-12-08: qty 2

## 2022-12-08 NOTE — ED Provider Notes (Signed)
[facility] Emergency Department Provider Note MRN:  [id number]  Arrival date & time: 12/08/22     Chief Complaint   Dysuria   History of Present Illness   Jaime Osborne is a 22 y.o. year-old female with no pertinent past medical history presenting to the ED with chief complaint of dysuria.  Patient actually with multiple complaints this evening.  Currently on her menstrual cycle.  Having some occasional abdominal cramping.  A few days ago having some burning with urination but this went away.  Might been due to a soap that caused irritation.  Having headache for the past 5 days similar to prior migraines, not going away.  Worse with bright lights and loud noises.  No numbness or weakness to the arms or legs, no fever.  Review of Systems  A thorough review of systems was obtained and all systems are negative except as noted in the HPI and PMH.   Patient's Health History    Past Medical History:  Diagnosis Date   UTI (urinary tract infection)     Past Surgical History:  Procedure Laterality Date   URETER SURGERY     stent placed for ureteral reflux    Family History  Problem Relation Age of Onset   Healthy Mother    Healthy Father    Cancer Maternal Grandmother        brain    Social History   Socioeconomic History   Marital status: Single    Spouse name: Not on file   Number of children: Not on file   Years of education: Not on file   Highest education level: Not on file  Occupational History   Not on file  Tobacco Use   Smoking status: Never   Smokeless tobacco: Current  Vaping Use   Vaping status: Every Day   Substances: Nicotine  Substance and Sexual Activity   Alcohol use: Yes    Comment: occ   Drug use: Never   Sexual activity: Yes    Birth control/protection: Injection  Other Topics Concern   Not on file  Social History Narrative   Not on file   Social Determinants of Health   Financial Resource Strain: Not on file   Food Insecurity: No Food Insecurity (10/07/2019)   Hunger Vital Sign    Worried About Running Out of Food in the Last Year: Never true    Ran Out of Food in the Last Year: Never true  Transportation Needs: No Transportation Needs (10/07/2019)   PRAPARE - Administrator, Civil Service (Medical): No    Lack of Transportation (Non-Medical): No  Physical Activity: Not on file  Stress: Not on file  Social Connections: Not on file  Intimate Partner Violence: Not on file     Physical Exam   Vitals:   12/08/22 0245 12/08/22 0300  BP: 114/71 106/68  Pulse: 74 71  Resp: 18 18  Temp:  98.5 F (36.9 C)  SpO2: 98% 100%    CONSTITUTIONAL: Well-appearing, NAD NEURO/PSYCH:  Alert and oriented x 3, normal and symmetric strength and sensation, normal coordination, normal speech EYES:  eyes equal and reactive ENT/NECK:  no LAD, no JVD CARDIO: Regular rate, well-perfused, normal S1 and S2 PULM:  CTAB no wheezing or rhonchi GI/GU:  non-distended, non-tender MSK/SPINE:  No gross deformities, no edema SKIN:  no rash, atraumatic   *Additional and/or pertinent findings included in MDM below  Diagnostic and Interventional Summary    EKG Interpretation Date/Time:  Ventricular Rate:    PR Interval:    QRS Duration:    QT Interval:    QTC Calculation:   R Axis:      Text Interpretation:         Labs Reviewed  PREGNANCY, URINE  URINALYSIS, ROUTINE W REFLEX MICROSCOPIC    No orders to display    Medications  ketorolac (TORADOL) 15 MG/ML injection 15 mg (15 mg Intravenous Given 12/08/22 0337)  diphenhydrAMINE (BENADRYL) injection 25 mg (25 mg Intravenous Given 12/08/22 0333)  prochlorperazine (COMPAZINE) injection 10 mg (10 mg Intravenous Given 12/08/22 0338)  sodium chloride 0.9 % bolus 1,000 mL (1,000 mLs Intravenous New Bag/Given 12/08/22 1478)     Procedures  /  Critical Care Procedures  ED Course and Medical Decision Making  Initial Impression and Ddx Suspect  migraine, no complicating features, nothing to suggest subarachnoid hemorrhage or meningitis.  Vitals normal, well-appearing, abdomen soft and nontender, UTI also considered given the recent dysuria.  Past medical/surgical history that increases complexity of ED encounter: None  Interpretation of Diagnostics I personally reviewed the urinalysis and my interpretation is as follows: No convincing evidence of infection    Patient Reassessment and Ultimate Disposition/Management     Patient feeling a lot better after migraine cocktail, continued reassuring neurological exam and vital signs, no indication for advanced imaging.  Appropriate for discharge.  Patient management required discussion with the following services or consulting groups:  None  Complexity of Problems Addressed Acute complicated illness or Injury  Additional Data Reviewed and Analyzed Further history obtained from: None  Additional Factors Impacting ED Encounter Risk None  Elmer Sow. Pilar Plate, MD [facility] Emergency Medicine [facility] [email or web address]  Final Clinical Impressions(s) / ED Diagnoses     ICD-10-CM   1. Nonintractable headache, unspecified chronicity pattern, unspecified headache type  R51.9       ED Discharge Orders     None        Discharge Instructions Discussed with and Provided to Patient:    Discharge Instructions      You were evaluated in the Emergency Department and after careful evaluation, we did not find any emergent condition requiring admission or further testing in the hospital.  Your exam/testing today is overall reassuring.  Symptoms possibly due to a migraine.  Recommend follow-up with your regular doctor to discuss her symptoms.  Please return to the Emergency Department if you experience any worsening of your condition.   Thank you for allowing [place] to be a part of your care.      Sabas Sous, MD 12/08/22 [phone]

## 2022-12-08 NOTE — Discharge Instructions (Signed)
You were evaluated in the Emergency Department and after careful evaluation, we did not find any emergent condition requiring admission or further testing in the hospital.  Your exam/testing today is overall reassuring.  Symptoms possibly due to a migraine.  Recommend follow-up with your regular doctor to discuss her symptoms.  Please return to the Emergency Department if you experience any worsening of your condition.   Thank you for allowing [place] to be a part of your care.

## 2023-03-10 ENCOUNTER — Ambulatory Visit: Payer: Medicaid Other | Admitting: Family Medicine

## 2023-03-10 NOTE — Progress Notes (Deleted)
 New Patient Office Visit  Subjective    Patient ID: Jaime Osborne, female    DOB: 23-Jun-2000  Age: 23 y.o. MRN: [id number]  CC: No chief complaint on file.   HPI Jaime Osborne presents to establish care today. Has history of GAD, MDD, recurrent UTIs.   Outpatient Encounter Medications as of 03/10/2023  Medication Sig   cariprazine (VRAYLAR) 1.5 MG capsule Vraylar 1.5 mg capsule  Take 1 capsule every day by oral route.   ciprofloxacin  (CIPRO ) 500 MG tablet Take 1 tablet (500 mg total) by mouth 2 (two) times daily.   ondansetron  (ZOFRAN -ODT) 8 MG disintegrating tablet Take 1 tablet (8 mg total) by mouth every 8 (eight) hours as needed for nausea.   phenazopyridine  (PYRIDIUM ) 200 MG tablet Take 1 tablet (200 mg total) by mouth 3 (three) times daily.   No facility-administered encounter medications on file as of 03/10/2023.    Past Medical History:  Diagnosis Date   UTI (urinary tract infection)     Past Surgical History:  Procedure Laterality Date   URETER SURGERY     stent placed for ureteral reflux    Family History  Problem Relation Age of Onset   Healthy Mother    Healthy Father    Cancer Maternal Grandmother        brain    Social History   Socioeconomic History   Marital status: Single    Spouse name: Not on file   Number of children: Not on file   Years of education: Not on file   Highest education level: Not on file  Occupational History   Not on file  Tobacco Use   Smoking status: Never   Smokeless tobacco: Current  Vaping Use   Vaping status: Every Day   Substances: Nicotine  Substance and Sexual Activity   Alcohol use: Yes    Comment: occ   Drug use: Never   Sexual activity: Yes    Birth control/protection: Injection  Other Topics Concern   Not on file  Social History Narrative   Not on file   Social Drivers of Health   Financial Resource Strain: Not on file  Food Insecurity: No Food Insecurity (10/07/2019)   Hunger Vital Sign     Worried About Running Out of Food in the Last Year: Never true    Ran Out of Food in the Last Year: Never true  Transportation Needs: No Transportation Needs (10/07/2019)   PRAPARE - Administrator, Civil Service (Medical): No    Lack of Transportation (Non-Medical): No  Physical Activity: Not on file  Stress: Not on file  Social Connections: Not on file  Intimate Partner Violence: Not on file    ROS Per HPI      Objective    There were no vitals taken for this visit.  Physical Exam Vitals and nursing note reviewed.  Constitutional:      Appearance: Normal appearance. She is normal weight.  HENT:     Head: Normocephalic and atraumatic.     Right Ear: Tympanic membrane and ear canal normal.     Left Ear: Tympanic membrane and ear canal normal.     Nose: Nose normal.  Eyes:     Extraocular Movements: Extraocular movements intact.     Pupils: Pupils are equal, round, and reactive to light.  Cardiovascular:     Rate and Rhythm: Normal rate and regular rhythm.     Heart sounds: Normal heart sounds.  Pulmonary:  Effort: Pulmonary effort is normal.     Breath sounds: Normal breath sounds.  Musculoskeletal:        General: Normal range of motion.     Cervical back: Normal range of motion.  Neurological:     General: No focal deficit present.     Mental Status: She is alert and oriented to person, place, and time.  Psychiatric:        Mood and Affect: Mood normal.        Thought Content: Thought content normal.       Assessment & Plan:   There are no diagnoses linked to this encounter.   No follow-ups on file.   Corean Ku, FNP

## 2023-03-25 ENCOUNTER — Encounter (HOSPITAL_BASED_OUTPATIENT_CLINIC_OR_DEPARTMENT_OTHER): Payer: Self-pay

## 2023-03-25 ENCOUNTER — Other Ambulatory Visit: Payer: Self-pay

## 2023-03-25 ENCOUNTER — Emergency Department (HOSPITAL_BASED_OUTPATIENT_CLINIC_OR_DEPARTMENT_OTHER)
Admission: EM | Admit: 2023-03-25 | Discharge: 2023-03-25 | Disposition: A | Discharge status: Home / Self Care | Payer: Medicaid Other | Attending: Emergency Medicine | Admitting: Emergency Medicine

## 2023-03-25 DIAGNOSIS — R197 Diarrhea, unspecified: Secondary | ICD-10-CM | POA: Diagnosis not present

## 2023-03-25 DIAGNOSIS — R112 Nausea with vomiting, unspecified: Secondary | ICD-10-CM | POA: Diagnosis not present

## 2023-03-25 DIAGNOSIS — Z20822 Contact with and (suspected) exposure to covid-19: Secondary | ICD-10-CM | POA: Diagnosis not present

## 2023-03-25 DIAGNOSIS — M791 Myalgia, unspecified site: Secondary | ICD-10-CM | POA: Insufficient documentation

## 2023-03-25 DIAGNOSIS — D72829 Elevated white blood cell count, unspecified: Secondary | ICD-10-CM | POA: Insufficient documentation

## 2023-03-25 LAB — URINALYSIS, ROUTINE W REFLEX MICROSCOPIC
Bilirubin Urine: NEGATIVE
Glucose, UA: NEGATIVE mg/dL
Hgb urine dipstick: NEGATIVE
Ketones, ur: 15 mg/dL — AB
Leukocytes,Ua: NEGATIVE
Nitrite: NEGATIVE
Specific Gravity, Urine: 1.035 — ABNORMAL HIGH (ref 1.005–1.030)
pH: 5.5 (ref 5.0–8.0)

## 2023-03-25 LAB — C DIFFICILE QUICK SCREEN W PCR REFLEX
C Diff antigen: NEGATIVE
C Diff interpretation: NOT DETECTED
C Diff toxin: NEGATIVE

## 2023-03-25 LAB — COMPREHENSIVE METABOLIC PANEL
ALT: 12 U/L (ref 0–44)
AST: 13 U/L — ABNORMAL LOW (ref 15–41)
Albumin: 4.6 g/dL (ref 3.5–5.0)
Alkaline Phosphatase: 35 U/L — ABNORMAL LOW (ref 38–126)
Anion gap: 11 (ref 5–15)
BUN: 19 mg/dL (ref 6–20)
CO2: 24 mmol/L (ref 22–32)
Calcium: 9 mg/dL (ref 8.9–10.3)
Chloride: 100 mmol/L (ref 98–111)
Creatinine, Ser: 0.8 mg/dL (ref 0.44–1.00)
GFR, Estimated: >60 mL/min (ref >60)
Glucose, Bld: 114 mg/dL — ABNORMAL HIGH (ref 70–99)
Potassium: 3.7 mmol/L (ref 3.5–5.1)
Sodium: 135 mmol/L (ref 135–145)
Total Bilirubin: 1.4 mg/dL — ABNORMAL HIGH (ref 0.0–1.2)
Total Protein: 7.3 g/dL (ref 6.5–8.1)

## 2023-03-25 LAB — RESP PANEL BY RT-PCR (RSV, FLU A&B, COVID)  RVPGX2
Influenza A by PCR: NEGATIVE
Influenza B by PCR: NEGATIVE
Resp Syncytial Virus by PCR: NEGATIVE
SARS Coronavirus 2 by RT PCR: NEGATIVE

## 2023-03-25 LAB — CBC
HCT: 41.8 % (ref 36.0–46.0)
Hemoglobin: 14.6 g/dL (ref 12.0–15.0)
MCH: 30.9 pg (ref 26.0–34.0)
MCHC: 34.9 g/dL (ref 30.0–36.0)
MCV: 88.6 fL (ref 80.0–100.0)
Platelets: 289 10*3/uL (ref 150–400)
RBC: 4.72 MIL/uL (ref 3.87–5.11)
RDW: 12.4 % (ref 11.5–15.5)
WBC: 22.3 10*3/uL — ABNORMAL HIGH (ref 4.0–10.5)
nRBC: 0 % (ref 0.0–0.2)

## 2023-03-25 LAB — PREGNANCY, URINE: Preg Test, Ur: NEGATIVE

## 2023-03-25 LAB — LIPASE, BLOOD: Lipase: 17 U/L (ref 11–51)

## 2023-03-25 MED ORDER — ONDANSETRON HCL 4 MG PO TABS: 4.0000 mg | ORAL_TABLET | Freq: Three times a day (TID) | ORAL | 0 refills | Status: AC | PRN

## 2023-03-25 MED ORDER — METOCLOPRAMIDE HCL 5 MG/ML IJ SOLN
5.0000 mg | Freq: Once | INTRAMUSCULAR | Status: AC
  Administered 2023-03-25: 5 mg via INTRAVENOUS
  Filled 2023-03-25: qty 2

## 2023-03-25 MED ORDER — SODIUM CHLORIDE 0.9 % IV BOLUS
1000.0000 mL | Freq: Once | INTRAVENOUS | Status: AC
  Administered 2023-03-25: 1000 mL via INTRAVENOUS

## 2023-03-25 MED ORDER — ACETAMINOPHEN 500 MG PO TABS
1000.0000 mg | ORAL_TABLET | Freq: Once | ORAL | Status: AC
  Administered 2023-03-25: 1000 mg via ORAL
  Filled 2023-03-25: qty 2

## 2023-03-25 NOTE — ED Notes (Signed)
Discharge paperwork given and verbally understood. 

## 2023-03-25 NOTE — Discharge Instructions (Addendum)
Please folllow up on your results on myChart.  Your respiratory panel is negative for COVID influenza or RSV. I am discharging you with Zofran.  You may use over-the-counter Imodium for diarrhea control. Continue frequent small sips (10-20 ml) of clear liquids every 5-10 minutes. Gatorade or powerade are good options. Avoid milk, orange juice, and grape juice for now. . Once you have not had further vomiting with the small sips for 4 hours, you may begin to drink larger volumes of fluids at a time and try a bland diet which may include saltine crackers, applesauce, breads, pastas, bananas, bland chicken. If you continues to vomit despite medication, return to the ED for repeat evaluation. Otherwise, follow up with your doctor in 2-3 days for a re-check.

## 2023-03-25 NOTE — ED Triage Notes (Addendum)
Pt to ED c/o nausea, vomiting, diarrhea , body aches x 1 day. 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 Denies respiratory symptoms.   Also reports bilat flank pain and lower abdominal pain

## 2023-03-25 NOTE — ED Provider Notes (Signed)
[place] EMERGENCY DEPARTMENT AT [facility] Provider Note   CSN: [id number] Arrival date & time: 03/25/23  1206     History  Chief Complaint  Patient presents with   Diarrhea   Emesis   Flank Pain    Jaime Osborne is a 23 y.o. female.  The emergency department with a chief complaint of nausea vomiting and diarrhea times about 12 hours.  Unable to hold anything down.  Patient concerned because she has been exposed to C. difficile colitis with patient that her job at [employer].  She denies fevers or chills.   Diarrhea Associated symptoms: vomiting   Emesis Associated symptoms: diarrhea   Flank Pain       Home Medications Prior to Admission medications   Medication Sig Start Date End Date Taking? Authorizing Provider  cariprazine (VRAYLAR) 1.5 MG capsule Vraylar 1.5 mg capsule  Take 1 capsule every day by oral route.    [provider]  ciprofloxacin (CIPRO) 500 MG tablet Take 1 tablet (500 mg total) by mouth 2 (two) times daily. 08/21/22   Derwood Kaplan, MD  ondansetron (ZOFRAN-ODT) 8 MG disintegrating tablet Take 1 tablet (8 mg total) by mouth every 8 (eight) hours as needed for nausea. 08/21/22   Derwood Kaplan, MD  phenazopyridine (PYRIDIUM) 200 MG tablet Take 1 tablet (200 mg total) by mouth 3 (three) times daily. 08/21/22   Derwood Kaplan, MD      Allergies    Penicillins, Sulfa antibiotics, and Bactrim [sulfamethoxazole-trimethoprim]    Review of Systems   Review of Systems  Gastrointestinal:  Positive for diarrhea and vomiting.  Genitourinary:  Positive for flank pain.    Physical Exam Updated Vital Signs BP 115/66   Pulse (!) 113   Temp 98.4 F (36.9 C)   Resp 18   Ht 5\' 3"  (1.6 m)   Wt 65.8 kg   SpO2 93%   BMI 25.69 kg/m  Physical Exam Vitals and nursing note reviewed.  Constitutional:      General: She is not in acute distress.    Appearance: She is well-developed. She is not diaphoretic.  HENT:     Head:  Normocephalic and atraumatic.     Right Ear: External ear normal.     Left Ear: External ear normal.     Nose: Nose normal.     Mouth/Throat:     Mouth: Mucous membranes are moist.  Eyes:     General: No scleral icterus.    Conjunctiva/sclera: Conjunctivae normal.  Cardiovascular:     Rate and Rhythm: Normal rate and regular rhythm.     Heart sounds: Normal heart sounds. No murmur heard.    No friction rub. No gallop.  Pulmonary:     Effort: Pulmonary effort is normal. No respiratory distress.     Breath sounds: Normal breath sounds.  Abdominal:     General: Bowel sounds are normal. There is no distension.     Palpations: Abdomen is soft. There is no mass.     Tenderness: There is no abdominal tenderness. There is no guarding.  Musculoskeletal:     Cervical back: Normal range of motion.  Skin:    General: Skin is warm and dry.  Neurological:     Mental Status: She is alert and oriented to person, place, and time.  Psychiatric:        Behavior: Behavior normal.     ED Results / Procedures / Treatments   Labs (all labs ordered are listed, but only  abnormal results are displayed) Labs Reviewed  COMPREHENSIVE METABOLIC PANEL - Abnormal; Notable for the following components:      Result Value   Glucose, Bld 114 (*)    AST 13 (*)    Alkaline Phosphatase 35 (*)    Total Bilirubin 1.4 (*)    All other components within normal limits  CBC - Abnormal; Notable for the following components:   WBC 22.3 (*)    All other components within normal limits  RESP PANEL BY RT-PCR (RSV, FLU A&B, COVID)  RVPGX2  C DIFFICILE QUICK SCREEN W PCR REFLEX    GASTROINTESTINAL PANEL BY PCR, STOOL (REPLACES STOOL CULTURE)  LIPASE, BLOOD  URINALYSIS, ROUTINE W REFLEX MICROSCOPIC  PREGNANCY, URINE    EKG None  Radiology No results found.  Procedures Procedures    Medications Ordered in ED Medications  metoCLOPramide (REGLAN) injection 5 mg (has no administration in time range)   acetaminophen (TYLENOL) tablet 1,000 mg (has no administration in time range)  sodium chloride 0.9 % bolus 1,000 mL (1,000 mLs Intravenous New Bag/Given 03/25/23 1350)    ED Course/ Medical Decision Making/ A&P Clinical Course as of 03/25/23 1639  Tue Mar 25, 2023  1315 WBC(!): 22.3 [AH]  1614 WBC(!): 22.3 [AH]    Clinical Course User Index [AH] Arthor Captain, PA-C                                 Medical Decision Making Amount and/or Complexity of Data Reviewed Labs: ordered. Decision-making details documented in ED Course.  Risk OTC drugs. Prescription drug management.    This is a 23 year old female with no contributing past medical history presents emergency department for nausea vomiting and diarrhea.  Differential diagnosis includes viral gastroenteritis, other viral infection, bacterial infection, food poisoning.  Patient has a benign abdominal exam without any tenderness to palpation.  She is concern for potential C. 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  They are currently pending and will not return today.  Patient's diarrhea and nausea vomiting have resolved.  She is tolerating p.o. fluids and food.  Will discharge with Zofran and may take over-the-counter Imodium.  Discussed outpatient follow-up and return precautions.  Reviewed the patient's lab.  She does have a significantly elevated white blood cell count.  He has no other significant findings.  She is afebrile and hemodynamically stable otherwise and appears appropriate for discharge.         Final Clinical Impression(s) / ED Diagnoses Final diagnoses:  None    Rx / DC Orders ED Discharge Orders     None         Arthor Captain, PA-C 03/25/23 1709    Benjiman Core, MD 03/25/23 2337

## 2023-03-26 ENCOUNTER — Telehealth (HOSPITAL_BASED_OUTPATIENT_CLINIC_OR_DEPARTMENT_OTHER): Payer: Self-pay

## 2023-03-26 LAB — GASTROINTESTINAL PANEL BY PCR, STOOL (REPLACES STOOL CULTURE)

## 2023-06-10 ENCOUNTER — Other Ambulatory Visit (HOSPITAL_COMMUNITY)
Admission: RE | Admit: 2023-06-10 | Discharge: 2023-06-10 | Disposition: A | Discharge status: Home / Self Care | Source: Ambulatory Visit | Attending: Family Medicine | Admitting: Family Medicine

## 2023-06-10 ENCOUNTER — Other Ambulatory Visit: Payer: Self-pay

## 2023-06-10 ENCOUNTER — Encounter: Payer: Self-pay | Admitting: Obstetrics and Gynecology

## 2023-06-10 ENCOUNTER — Ambulatory Visit (INDEPENDENT_AMBULATORY_CARE_PROVIDER_SITE_OTHER): Admitting: Obstetrics and Gynecology

## 2023-06-10 VITALS — BP 112/70 | HR 86 | Wt 140.0 lb

## 2023-06-10 DIAGNOSIS — Z3009 Encounter for other general counseling and advice on contraception: Secondary | ICD-10-CM

## 2023-06-10 DIAGNOSIS — Z124 Encounter for screening for malignant neoplasm of cervix: Secondary | ICD-10-CM | POA: Insufficient documentation

## 2023-06-10 DIAGNOSIS — Z1331 Encounter for screening for depression: Secondary | ICD-10-CM

## 2023-06-10 DIAGNOSIS — Z8742 Personal history of other diseases of the female genital tract: Secondary | ICD-10-CM | POA: Diagnosis present

## 2023-06-10 DIAGNOSIS — Z202 Contact with and (suspected) exposure to infections with a predominantly sexual mode of transmission: Secondary | ICD-10-CM | POA: Diagnosis not present

## 2023-06-10 DIAGNOSIS — N896 Tight hymenal ring: Secondary | ICD-10-CM

## 2023-06-10 DIAGNOSIS — L918 Other hypertrophic disorders of the skin: Secondary | ICD-10-CM | POA: Diagnosis not present

## 2023-06-11 DIAGNOSIS — L918 Other hypertrophic disorders of the skin: Secondary | ICD-10-CM | POA: Insufficient documentation

## 2023-06-11 LAB — RPR+HBSAG+HCVAB+...
HIV Screen 4th Generation wRfx: NONREACTIVE
Hep C Virus Ab: NONREACTIVE
Hepatitis B Surface Ag: NEGATIVE
RPR Ser Ql: NONREACTIVE

## 2023-06-11 NOTE — Progress Notes (Unsigned)
 Obstetrics and Gynecology Visit Return Patient Evaluation  Appointment Date: 06/10/2023  OBGYN Clinic: Center for [facility]   Chief Complaint: contraception management, ?wart on mons, ?vaginal introitus abnormality, pap smear  History of Present Illness:  Jaime Osborne is a 23 y.o. G0 with above chief complaint.  Patient interested in something for contraception. She has qmonth, regular periods that are 5 days and not heavy or painful. H/o depo provera and nexplanon but would prefer something non hormonal  Patient noticed a ?wart on the mons area after the last time she shaved. No prior s/s and no h/o genital warts  Patient notes a piece of tissue at the lower left vaginal introitus and wondering if normal. If not enough lubrication, she does note some dyspareunia there.   H/o LSIL pap prior to age 56 and repeat today.   Review of Systems: as noted in the History of Present Illness.  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  Physical Exam:  BP 112/70   Pulse 86   Wt 140 lb (63.5 kg)   LMP 05/14/2023 (Exact Date)   BMI 24.80 kg/m  Body mass index is 24.8 kg/m. General appearance: Well nourished, well developed female in no acute distress.  Abdomen: diffusely non tender to palpation, non distended, and no masses, hernias Neuro/Psych:  Normal mood and affect.    Pelvic exam:*** {details:315906::normal adnexa in size, nontender and no masses}, exam chaperoned by RN.  Assessment: ***  Plan:  1. Cervical cancer screening (Primary) *** - Cytology - PAP  2. STD exposure *** - RPR+HBsAg+HCVAb+...  3. History of abnormal cervical Pap smear *** -  Cytology - PAP  4. Skin tag ***  5. Thickening of hymen ***  6. Encounter for other general counseling or advice on contraception ***   RTC: ***  Return in about 1 week (around 06/17/2023) for in person.  Future Appointments  Date Time Provider Department Center  06/16/2023 11:15 AM Logan Bing, MD [facility]    Cornelia Copa MD Attending Center for [facility])

## 2023-06-13 LAB — CYTOLOGY - PAP
Chlamydia: NEGATIVE
Comment: NEGATIVE
Comment: NEGATIVE
Comment: NORMAL
Diagnosis: NEGATIVE
Neisseria Gonorrhea: NEGATIVE
Trichomonas: NEGATIVE

## 2023-06-16 ENCOUNTER — Ambulatory Visit: Admitting: Obstetrics and Gynecology

## 2023-06-16 ENCOUNTER — Encounter: Payer: Self-pay | Admitting: Obstetrics and Gynecology

## 2023-06-17 NOTE — Progress Notes (Signed)
 Patient did not keep her GYN appointment for 06/16/2023.  Tyler Gallant MD Attending Center for [facility])

## 2023-06-20 ENCOUNTER — Encounter: Payer: Self-pay | Admitting: Obstetrics and Gynecology

## 2023-07-24 ENCOUNTER — Ambulatory Visit (INDEPENDENT_AMBULATORY_CARE_PROVIDER_SITE_OTHER): Payer: Self-pay | Admitting: Obstetrics and Gynecology

## 2023-07-24 ENCOUNTER — Other Ambulatory Visit: Payer: Self-pay

## 2023-07-24 ENCOUNTER — Encounter: Payer: Self-pay | Admitting: Obstetrics and Gynecology

## 2023-07-24 VITALS — BP 119/77 | HR 114 | Ht 63.0 in | Wt 137.3 lb

## 2023-07-24 DIAGNOSIS — Z30430 Encounter for insertion of intrauterine contraceptive device: Secondary | ICD-10-CM

## 2023-07-24 DIAGNOSIS — Z3202 Encounter for pregnancy test, result negative: Secondary | ICD-10-CM | POA: Diagnosis not present

## 2023-07-24 LAB — POCT PREGNANCY, URINE: Preg Test, Ur: NEGATIVE

## 2023-07-24 NOTE — Progress Notes (Signed)
    GYNECOLOGY OFFICE PROCEDURE NOTE  Jaime Osborne is a 23 y.o. G0P0000 here for paragard IUD insertion. No GYN concerns.  Last pap smear was on 4/25 and was normal.  IUD Insertion Procedure Note Patient identified, informed consent performed, consent signed.   Discussed risks of irregular bleeding, cramping, infection, malpositioning or misplacement of the IUD outside the uterus which may require further procedure such as laparoscopy. Also discussed >99% contraception efficacy, increased risk of ectopic pregnancy with failure of method.  Time out was performed.  Urine pregnancy test negative.  Speculum placed in the vagina.  Cervix visualized.  Cleaned with Betadine x 2.  Grasped anteriorly with a single tooth tenaculum.  Uterus sounded to 5 cm.  Per manufacturers instructions, IUD should not be placed in uterine cavity less than 6 cm.  Procedure was aborted.  Did discuss alternatives such as phexxi, nuvaring and contraceptive patch.  She may also be interested in skyla or kyleena ism they can be placed in a smaller uterus.   Avie Boeck, MD, FACOG Obstetrician & Gynecologist, [facility], [facility]

## 2023-08-21 ENCOUNTER — Encounter: Payer: Self-pay | Admitting: Obstetrics and Gynecology

## 2023-08-22 ENCOUNTER — Other Ambulatory Visit: Payer: Self-pay | Admitting: Obstetrics and Gynecology

## 2023-08-22 DIAGNOSIS — Z30011 Encounter for initial prescription of contraceptive pills: Secondary | ICD-10-CM

## 2023-08-22 MED ORDER — NORETHIN ACE-ETH ESTRAD-FE 1-20 MG-MCG(24) PO TABS: 1.0000 | ORAL_TABLET | Freq: Every day | ORAL | 3 refills | Status: AC

## 2023-08-22 NOTE — Progress Notes (Signed)
Rx for OCP sent.
